# Patient Record
Sex: Male | Born: 1985 | Race: Black or African American | Hispanic: No | Marital: Single | Smoking: Never smoker
Health system: Southern US, Community
[De-identification: ages and names within clinical notes are randomized; demographics above are authoritative.]

---

## 2010-04-05 ENCOUNTER — Emergency Department (HOSPITAL_BASED_OUTPATIENT_CLINIC_OR_DEPARTMENT_OTHER)
Admission: EM | Admit: 2010-04-05 | Discharge: 2010-04-05 | Payer: Self-pay | Source: Home / Self Care | Admitting: Emergency Medicine

## 2010-04-05 LAB — URINALYSIS, ROUTINE W REFLEX MICROSCOPIC
Ketones, ur: NEGATIVE mg/dL
Nitrite: NEGATIVE
Protein, ur: NEGATIVE mg/dL
Urobilinogen, UA: 0.2 mg/dL (ref 0.0–1.0)
pH: 5.5 (ref 5.0–8.0)

## 2010-05-18 ENCOUNTER — Emergency Department (HOSPITAL_BASED_OUTPATIENT_CLINIC_OR_DEPARTMENT_OTHER)
Admission: EM | Admit: 2010-05-18 | Discharge: 2010-05-18 | Disposition: A | Discharge status: Home / Self Care | Payer: Self-pay | Attending: Emergency Medicine | Admitting: Emergency Medicine

## 2010-05-18 DIAGNOSIS — K219 Gastro-esophageal reflux disease without esophagitis: Secondary | ICD-10-CM | POA: Insufficient documentation

## 2010-05-18 DIAGNOSIS — R110 Nausea: Secondary | ICD-10-CM | POA: Insufficient documentation

## 2010-05-18 DIAGNOSIS — K5289 Other specified noninfective gastroenteritis and colitis: Secondary | ICD-10-CM | POA: Insufficient documentation

## 2014-07-12 ENCOUNTER — Emergency Department (HOSPITAL_BASED_OUTPATIENT_CLINIC_OR_DEPARTMENT_OTHER)
Admission: EM | Admit: 2014-07-12 | Discharge: 2014-07-12 | Disposition: A | Discharge status: Home / Self Care | Payer: Medicaid Other | Attending: Emergency Medicine | Admitting: Emergency Medicine

## 2014-07-12 ENCOUNTER — Encounter (HOSPITAL_BASED_OUTPATIENT_CLINIC_OR_DEPARTMENT_OTHER): Payer: Self-pay

## 2014-07-12 DIAGNOSIS — K088 Other specified disorders of teeth and supporting structures: Secondary | ICD-10-CM | POA: Diagnosis not present

## 2014-07-12 DIAGNOSIS — K0889 Other specified disorders of teeth and supporting structures: Secondary | ICD-10-CM

## 2014-07-12 MED ORDER — BUPIVACAINE-EPINEPHRINE (PF) 0.5% -1:200000 IJ SOLN
1.8000 mL | Freq: Once | INTRAMUSCULAR | Status: AC
  Administered 2014-07-12: 1.8 mL
  Filled 2014-07-12: qty 1.8

## 2014-07-12 MED ORDER — PENICILLIN V POTASSIUM 500 MG PO TABS: 500.0000 mg | ORAL_TABLET | Freq: Four times a day (QID) | ORAL | Status: AC

## 2014-07-12 NOTE — ED Provider Notes (Signed)
CSN: [id number]     Arrival date & time 07/12/14  1220 History   First MD Initiated Contact with Patient 07/12/14 1250     Chief Complaint  Patient presents with  . Dental Pain     (Consider location/radiation/quality/duration/timing/severity/associated sxs/prior Treatment) HPI  Nathaniel Allen is a 29 y.o. male presenting with left lower wisdom tooth pain for a while with acute worsening last couple days. Patient reports pain to left jaw with quality and opening his mouth. Patient has been taking NSAIDs with mild improvement but it returns. Cold foods make it better. He has also been taking an antibiotic with the last 2 pills today for possible dental infection. Patient states he is waiting for his Medicare to come through to be able see a dentist. No fevers, chills, facial swelling, tongue swelling, drooling, chest pain, shortness of breath.   History reviewed. No pertinent past medical history. History reviewed. No pertinent past surgical history. No family history on file. History  Substance Use Topics  . Smoking status: Never Smoker   . Smokeless tobacco: Not on file  . Alcohol Use: No    Review of Systems  Constitutional: Negative for fever and chills.  HENT: Positive for dental problem. Negative for drooling.   Respiratory: Negative for shortness of breath.   Cardiovascular: Negative for chest pain.  Gastrointestinal: Negative for nausea and vomiting.      Allergies  Review of patient's allergies indicates no known allergies.  Home Medications   Prior to Admission medications   Medication Sig Start Date End Date Taking? Authorizing Provider  penicillin v potassium (VEETID) 500 MG tablet Take 1 tablet (500 mg total) by mouth 4 (four) times daily. 07/12/14 07/19/14  Oswaldo Conroy, PA-C   BP 135/79 mmHg  Pulse 65  Temp(Src) 99.2 F (37.3 C) (Oral)  Resp 18  Ht  (1.753 m)  Wt 185 lb (83.915 kg)  BMI 27.31 kg/m2  SpO2 99% Physical Exam  Constitutional: He appears  well-developed and well-nourished. No distress.  HENT:  Head: Normocephalic and atraumatic.  Mouth/Throat: Oropharynx is clear and moist. No oropharyngeal exudate.  No trismus or uvula deviation. Easily placed 3 fingers in mouth. No lip, tongue, facial swelling. No swelling under tongue or tenderness. Patient handling secretions. No drooling. Patient with poor dentition. Patient with tenderness to left lower back molar with partial interruption through gum with mild erythema in this gingiva but no drainable abscess.   Eyes: Conjunctivae are normal. Right eye exhibits no discharge. Left eye exhibits no discharge.  Neck: Normal range of motion. Neck supple.  No neck masses or tenderness.  Cardiovascular: Normal rate and regular rhythm.   Pulmonary/Chest: Effort normal and breath sounds normal. No respiratory distress. He has no wheezes.  Abdominal: Soft. He exhibits no distension. There is no tenderness.  Lymphadenopathy:    He has no cervical adenopathy.  Neurological: He is alert. Coordination normal.  Skin: Skin is warm and dry. He is not diaphoretic.  Nursing note and vitals reviewed.   ED Course  Dental Date/Time: 07/12/2014 1:33 PM Performed by: Oswaldo Conroy Authorized by: Oswaldo Conroy Consent: Verbal consent obtained. Risks and benefits: risks, benefits and alternatives were discussed Consent given by: patient Patient understanding: patient states understanding of the procedure being performed Test results: test results available and properly labeled Patient identity confirmed: verbally with patient Time out: Immediately prior to procedure a "time out" was called to verify the correct patient, procedure, equipment, support staff and site/side marked as required. Local  anesthesia used: yes Anesthesia: nerve block Local anesthetic: bupivacaine 0.5% with epinephrine Patient sedated: no Patient tolerance: Patient tolerated the procedure well with no immediate  complications   (including critical care time) Labs Review Labs Reviewed - No data to display  Imaging Review No results found.   EKG Interpretation None      MDM   Final diagnoses:  Pain, dental   Patient with toothache.  No gross abscess.  No history of diabetes. Exam unconcerning for Ludwig's angina or spread of infection.  Will treat with penicillin and NSAIDs.  Pt treated with dental block with improvement of symptoms. Urged patient to follow-up with dentist.  Pt given referral to dentist on call.  Discussed return precautions with patient. Discussed all results and patient verbalizes understanding and agrees with plan.   Oswaldo ConroyVictoria Anayansi Rundquist, PA-C 07/12/14 1434  Blane OharaJoshua Zavitz, MD 07/14/14 [phone]

## 2014-07-12 NOTE — Discharge Instructions (Signed)
Return to the emergency room with worsening of symptoms, new symptoms or with symptoms that are concerning, especially fevers, unable to open mouth fully, facial, tongue swelling, chest pain, shortness of breath. Please take all of your antibiotics until finished!   You may develop abdominal discomfort or diarrhea from the antibiotic.  You may help offset this with probiotics which you can buy or get in yogurt. Do not eat  or take the probiotics until 2 hours after your antibiotic.  Ibuprofen  (2 tablets ) every 5-6 hours for 3-5 days. Call to make appointment with dentist. Number provided above. And or use below information to follow up with dentist.   Emergency Department Resource Guide 1) Find a Doctor and Pay Out of Pocket Although you won't have to find out who is covered by your insurance plan, it is a good idea to ask around and get recommendations. You will then need to call the office and see if the doctor you have chosen will accept you as a new patient and what types of options they offer for patients who are self-pay. Some doctors offer discounts or will set up payment plans for their patients who do not have insurance, but you will need to ask so you aren't surprised when you get to your appointment.  2) Contact Your Local Health Department Not all health departments have doctors that can see patients for sick visits, but many do, so it is worth a call to see if yours does. If you don't know where your local health department is, you can check in your phone book. The CDC also has a tool to help you locate your state's health department, and many state websites also have listings of all of their local health departments.  3) Find a Walk-in Clinic If your illness is not likely to be very severe or complicated, you may want to try a walk in clinic. These are popping up all over the country in pharmacies, drugstores, and shopping centers. They're usually staffed by nurse  practitioners or physician assistants that have been trained to treat common illnesses and complaints. They're usually fairly quick and inexpensive. However, if you have serious medical issues or chronic medical problems, these are probably not your best option.  No Primary Care Doctor: - Call Health Connect at  [phone] - they can help you locate a primary care doctor that  accepts your insurance, provides certain services, etc. - Physician Referral Service- [phone]  Chronic Pain Problems: Organization         Address  Phone   Notes  [facility]  [phone] Patients need to be referred by their primary care doctor.   Medication Assistance: Organization         Address  Phone   Notes  [facility] Medication [facility] [address] [place] [place], [place] [address] [phone] --Must be a resident of [facility] -- Must have NO insurance coverage whatsoever (no Medicaid/ Medicare, etc.) -- The pt. MUST have a primary care doctor that directs their care regularly and follows them in the community   MedAssist  [phone]   [employer]  [phone]    Agencies that provide inexpensive medical care: Organization         Address  Phone   Notes  [facility]  [phone]   [facility]    [phone]   [facility] [address]  [place], [place] [address] [phone]   [facility] [address], [place] [phone]) [phone]   Planned Parenthood    [phone]) [phone]   [facility]    [phone](336) [phone]   [facility]  [address], [place] Phone:  [phone](336) [phone], Fax:  [phone]) [phone] Hours of Operation:  9 am - 6 pm, M-F.  Also accepts Medicaid/Medicare and self-pay.  [facility]  [address], [place] Phone: [phone]) [phone], Fax: [phone]) [phone]. Hours of Operation:  8:30 am -  5:30 pm, M-F.  Also accepts Medicaid and self-pay.  [facility] [place] [address], [place] Phone: [phone](336) [phone]   [facility] [address], [place], [place] [phone], Ext. [phone] Mondays & Thursdays: 7-9 AM.  First 15 patients are seen on a first come, first serve basis.    Medicaid-accepting [facility] Providers:  Organization         Address  Phone   Notes  [facility] [address], [place] [phone]) [phone] Also accepts self-pay patients.  [facility] [address], [place]  [phone](336) [phone]   [facility] [address], [place] [phone](336) [phone]   [facility] [address] [place] [address], [place] [phone]) [phone]   Renaye RakersVeita Bland [address], [place]   [phone]) [phone] Only accepts [place] Access [place]icaid patients after they have their name applied to their card.   Self-Pay (no insurance) in [facility]:  Organization         Address  Phone   Notes  Sickle Cell Patients, [facility] [address], [place] [phone]) [phone]   [facility] [address], [place] [phone]) [phone]   [facility]  [address], [place] [phone](336) [phone]   [facility]/Dr. Osei-Bonsu  [address] [place] [address], [place] or [phone], [place] [phone]) [phone] Phone number for both [place] and [place] locations is the same.  [facility] [address], [place] [phone]) [phone]   [facility]  [address] [place] [address], [place] or [address] [phone]) [phone]   [facility] [address], [place] [phone](336) [phone], phone; [phone](336) [phone], fax Sees patients 1st and 3rd Saturday of every month.  Must not qualify for public or private insurance (i.e. Medicaid, Medicare, [place] Health Choice,  Veterans' Benefits)  Household income should be no more than 200% of the poverty level The clinic cannot treat you if you are pregnant or think you are pregnant  Sexually transmitted diseases are not treated at the clinic.    Dental Care: Organization         Address  Phone  Notes  [facility] Department of [facility] [address], [place] [phone]) [phone] Accepts children up to age 31 who are enrolled in [place]icaid or [place] Health Choice; pregnant women with a Medicaid card; and children who have applied for Medicaid or [place] Health Choice, but were declined, whose parents can pay a reduced fee at time of service.  [facility] of [facility] [place]  [address], [place] [phone]) [phone] Accepts children up to age 31 who are enrolled in [place]icaid or [place] Health Choice;  pregnant women with a Medicaid card; and children who have applied for Medicaid or [place] Health Choice, but were declined, whose parents can pay a reduced fee at time of service.  [facility]  [address], [place] [phone]) [phone] Patients are seen by appointment only. Walk-ins are not accepted. [facility] will see patients 31 years of age and older. Monday - Tuesday (8am-5pm) Most Wednesdays (8:30-5pm) $30 per visit, cash only  [facility]  [address], [facility] [place] [phone] Patients are seen by appointment only. Walk-ins are not accepted. [facility] will see patients 31 years of age and older. One Wednesday Evening (Monthly: Volunteer Based).  $30 per visit, cash only  [facility]  [phone] for adults; Children under age 494, call Graduate Pediatric Dentistry at [phone]) [phone]. Children aged 254-14, please call [phone] to request a pediatric application.  Dental services are provided in all areas of dental care including fillings, crowns and bridges, complete and  partial dentures, implants, gum treatment, root canals, and extractions. Preventive care is also provided. Treatment is provided to both adults and children. Patients are selected via a lottery and there is often a waiting list.   [facility] [address], [place]  [phone]) [phone] [email or web address]   [facility] [address], [place], [place] [phone], Ext. [phone] Second and Fourth Thursday of each month, opens at 6:30 AM; Clinic ends at 9 AM.  Patients are seen on a first-come first-served basis, and a limited number are seen during each clinic.   [facility]  [address], [place], [place] [phone](336) [phone]   Eligibility Requirements You must have lived in [place], [place], or [place] counties for at least the last three months.   You cannot be eligible for state or federal sponsored National Cityhealthcare insurance, including CIGNAVeterans Administration, [place]icaid, or [employer] EntertainmentMedicare.   You generally cannot be eligible for healthcare insurance through your employer.    How to apply: Eligibility screenings are held every Tuesday and Wednesday afternoon from 1:00 pm until 4:00 pm. You do not need an appointment for the interview!  [facility] [address], [place], [place] [phone]   [facility]  [phone]   [facility]  [phone]   [facility]  [phone]    Behavioral Health Resources in the Community: Intensive Outpatient Programs Organization         Address  Phone  Notes  [facility] [address], [place], [place] [phone]   [facility] [address], [place], [place] [phone]   [facility] [address], [place], [place]  [phone]   [facility] [address],  [place], [place] [phone] or [phone]   Substance Abuse Resources Organization          Address  Phone  Notes  [facility]  [phone]   [facility]  [phone]   [facility]  [phone]   [facility]  [phone]   Residential & Outpatient Substance Abuse Program  [phone]   Psychological Services Organization         Address  Phone  Notes  [facility]  [phone]- [phone]   [facility]  [phone]- [phone]   [facility] [address], [place] [phone] or [phone]    Mobile  Crisis Teams Organization         Address  Phone  Notes  [facility], Mobile Crisis Care Unit  [phone]   [facility]  [address] [place], [place] [phone]   [facility] De[facility]sch [address] [place] [phone]    Self-Help/Support Groups Organization         Address  Phone             Notes  Mental Health Assoc. of [place] - variety of support groups  336- [phone] Call for more information  Narcotics Anonymous (NA), [facility]aring [facility] [address], [place]  2 meetings at this location   Statisticianesidential Treatment Programs Organization         Address  Phone  Notes  [facility] [address],    [place]  [phone]   [facility]  [address], [place] [phone], [place], [place] [phone]   [facility] [address], [place] [phone] Admissions: 8am-3pm M-F  [facility] [address]    [place], [place] [phone]   [facility] [address], [place], [place] [phone]   [facility] [address]  [place], [place] [phone]   [facility] - Intensive Outpatient [address], [place], [place] [phone]   [facility] ([facility] [address]  [place], [place] [phone] or [phone]   [facility] ([facility]) [address] [place], [place]  [phone] Accepts Medicaid  [facility] [address]  [place] [phone] Substance Abuse/Addiction Treatment   [facility] Behavioral Health Resources Organization         Address  Phone  Notes  [facility]  [phone]   Angie FavaJulie Brannon, PhD [address], [address] [place], [place]   [phone](336) [phone] or [phone]   [facility]   [address] [place], [place] [phone]) [phone]   [facility] [address], [place], [place] [phone](336) [phone] Insurance/Medicaid/sponsorship through [facility]  [facility] Families [address]                                    [place], [place] [phone](336) [phone] Therapy/tele-psych/case  [facility] [address].   [place], [place] [phone](336) [phone]    Dr. Lolly MustacheArfeen  [phone](336) [phone]   [facility]  [employer] [facility] 1) [address], [place] 2) [address], [place] 3)  [address], [place] [phone]) [phone](336) [phone]  [phone](336) [phone]   [facility] Child Abuse Hotline [phone](336) [phone] or [phone](336) [phone] (After Hours)

## 2014-07-12 NOTE — ED Notes (Addendum)
Left lower wisdom tooth pain "for a while"-pain to left jaw with difficulty opening mouth

## 2014-07-12 NOTE — ED Notes (Signed)
PA at bedside.

## 2014-10-23 ENCOUNTER — Emergency Department (HOSPITAL_BASED_OUTPATIENT_CLINIC_OR_DEPARTMENT_OTHER)
Admission: EM | Admit: 2014-10-23 | Discharge: 2014-10-23 | Disposition: A | Discharge status: Home / Self Care | Payer: Medicaid Other | Attending: Physician Assistant | Admitting: Physician Assistant

## 2014-10-23 ENCOUNTER — Encounter (HOSPITAL_BASED_OUTPATIENT_CLINIC_OR_DEPARTMENT_OTHER): Payer: Self-pay | Admitting: *Deleted

## 2014-10-23 DIAGNOSIS — G5621 Lesion of ulnar nerve, right upper limb: Secondary | ICD-10-CM | POA: Diagnosis not present

## 2014-10-23 DIAGNOSIS — R202 Paresthesia of skin: Secondary | ICD-10-CM | POA: Insufficient documentation

## 2014-10-23 DIAGNOSIS — M25562 Pain in left knee: Secondary | ICD-10-CM | POA: Diagnosis not present

## 2014-10-23 DIAGNOSIS — R200 Anesthesia of skin: Secondary | ICD-10-CM | POA: Diagnosis present

## 2014-10-23 DIAGNOSIS — M549 Dorsalgia, unspecified: Secondary | ICD-10-CM | POA: Insufficient documentation

## 2014-10-23 NOTE — ED Notes (Signed)
Sling applied to rt arm per EDP orders, pt teaching done on re: appropriate sling application

## 2014-10-23 NOTE — Discharge Instructions (Signed)
Peripheral Neuropathy °Peripheral neuropathy is a type of nerve damage. It affects nerves that carry signals between the spinal cord and other parts of the body. These are called peripheral nerves. With peripheral neuropathy, one nerve or a group of nerves may be damaged.  °CAUSES  °Many things can damage peripheral nerves. For some people with peripheral neuropathy, the cause is unknown. Some causes include: °· Diabetes. This is the most common cause of peripheral neuropathy. °· Injury to a nerve. °· Pressure or stress on a nerve that lasts a long time. °· Too little vitamin B. Alcoholism can lead to this. °· Infections. °· Autoimmune diseases, such as multiple sclerosis and systemic lupus erythematosus. °· Inherited nerve diseases. °· Some medicines, such as cancer drugs. °· Toxic substances, such as lead and mercury. °· Too little blood flowing to the legs. °· Kidney disease. °· Thyroid disease. °SIGNS AND SYMPTOMS  °Different people have different symptoms. The symptoms you have will depend on which of your nerves is damaged.  Common symptoms include: °· Loss of feeling (numbness) in the feet and hands. °· Tingling in the feet and hands. °· Pain that burns. °· Very sensitive skin. °· Weakness. °· Not being able to move a part of the body (paralysis). °· Muscle twitching. °· Clumsiness or poor coordination. °· Loss of balance. °· Not being able to control your bladder. °· Feeling dizzy. °· Sexual problems. °DIAGNOSIS  °Peripheral neuropathy is a symptom, not a disease. Finding the cause of peripheral neuropathy can be hard. To figure that out, your health care provider will take a medical history and do a physical exam. A neurological exam will also be done. This involves checking things affected by your brain, spinal cord, and nerves (nervous system). For example, your health care provider will check your reflexes, how you move, and what you can feel.  °Other types of tests may also be ordered, such as: °· Blood  tests. °· A test of the fluid in your spinal cord. °· Imaging tests, such as CT scans or an MRI. °· Electromyography (EMG). This test checks the nerves that control muscles. °· Nerve conduction velocity tests. These tests check how fast messages pass through your nerves. °· Nerve biopsy. A small piece of nerve is removed. It is then checked under a microscope. °TREATMENT  °· Medicine is often used to treat peripheral neuropathy. Medicines may include: °¨ Pain-relieving medicines. Prescription or over-the-counter medicine may be suggested. °¨ Antiseizure medicine. This may be used for pain. °¨ Antidepressants. These also may help ease pain from neuropathy. °¨ Lidocaine. This is a numbing medicine. You might wear a patch or be given a shot. °¨ Mexiletine. This medicine is typically used to help control irregular heart rhythms. °· Surgery. Surgery may be needed to relieve pressure on a nerve or to destroy a nerve that is causing pain. °· Physical therapy to help movement. °· Assistive devices to help movement. °HOME CARE INSTRUCTIONS  °· Only take over-the-counter or prescription medicines as directed by your health care provider. Follow the instructions carefully for any given medicines. Do not take any other medicines without first getting approval from your health care provider. °· If you have diabetes, work closely with your health care provider to keep your blood sugar under control. °· If you have numbness in your feet: °¨ Check every day for signs of injury or infection. Watch for redness, warmth, and swelling. °¨ Wear padded socks and comfortable shoes. These help protect your feet. °· Do not do   things that put pressure on your damaged nerve.  Do not smoke. Smoking keeps blood from getting to damaged nerves.  Avoid or limit alcohol. Too much alcohol can cause a lack of B vitamins. These vitamins are needed for healthy nerves.  Develop a good support system. Coping with peripheral neuropathy can be  stressful. Talk to a mental health specialist or join a support group if you are struggling.  Follow up with your health care provider as directed. SEEK MEDICAL CARE IF:   You have new signs or symptoms of peripheral neuropathy.  You are struggling emotionally from dealing with peripheral neuropathy.  You have a fever. SEEK IMMEDIATE MEDICAL CARE IF:   You have an injury or infection that is not healing.  You feel very dizzy or begin vomiting.  You have chest pain.  You have trouble breathing. Document Released: 02/16/2002 Document Revised: 11/08/2010 Document Reviewed: 11/03/2012 [facility] Patient Information 2015 [place], [place]. This information is not intended to replace advice given to you by your health care provider. Make sure you discuss any questions you have with your health care provider.  Electromyography (EMG) Test This is a test in which very small electrodes are placed into your muscle tissue. It looks at the electrical impulses of your muscle tissue. This test is used to determine whether or not there are involuntary or spontaneous muscle movements. Involuntary or spontaneous means muscle movements that happen by themselves. This may indicate injury or disease of the nerves which supply that muscle. PREPARATION FOR TEST No preparation or fasting is necessary. Some stimulants such as caffeine and tobacco may need to be avoided for 2-3 hours before test or as instructed by your caregiver. NORMAL FINDINGS No evidence of neuromuscular abnormalities. Ranges for normal findings may vary among different laboratories and hospitals. You should always check with your doctor after having lab work or other tests done to discuss the meaning of your test results and whether your values are considered within normal limits. MEANING OF TEST  Your caregiver will go over the test results with you and discuss the importance and meaning of your results, as well as treatment options and the  need for additional tests if necessary. OBTAINING THE TEST RESULTS It is your responsibility to obtain your test results. Ask the lab or department performing the test when and how you will get your results. Document Released: 06/29/2004 Document Revised: 05/21/2011 Document Reviewed: 02/06/2008 [facility] Patient Information 2015 [place], [place]. This information is not intended to replace advice given to you by your health care provider. Make sure you discuss any questions you have with your health care provider.  Emergency Department Resource Guide 1) Find a Doctor and Pay Out of Pocket Although you won't have to find out who is covered by your insurance plan, it is a good idea to ask around and get recommendations. You will then need to call the office and see if the doctor you have chosen will accept you as a new patient and what types of options they offer for patients who are self-pay. Some doctors offer discounts or will set up payment plans for their patients who do not have insurance, but you will need to ask so you aren't surprised when you get to your appointment.  2) Contact Your Local Health Department Not all health departments have doctors that can see patients for sick visits, but many do, so it is worth a call to see if yours does. If you don't know where your local health  department is, you can check in your phone book. The CDC also has a tool to help you locate your state's health department, and many state websites also have listings of all of their local health departments.  3) Find a Walk-in Clinic If your illness is not likely to be very severe or complicated, you may want to try a walk in clinic. These are popping up all over the country in pharmacies, drugstores, and shopping centers. They're usually staffed by nurse practitioners or physician assistants that have been trained to treat common illnesses and complaints. They're usually fairly quick and inexpensive. However, if you  have serious medical issues or chronic medical problems, these are probably not your best option.  No Primary Care Doctor: - Call Health Connect at  [phone] - they can help you locate a primary care doctor that  accepts your insurance, provides certain services, etc. - Physician Referral Service- [phone]  Chronic Pain Problems: Organization         Address  Phone   Notes  [facility]  [phone] Patients need to be referred by their primary care doctor.   Medication Assistance: Organization         Address  Phone   Notes  [facility] [address] [place] [place], [place] [address] [phone] --Must be a resident of [facility] -- Must have NO insurance coverage whatsoever (no Medicaid/ Medicare, etc.) -- The pt. MUST have a primary care doctor that directs their care regularly and follows them in the community   [facility]ist  [phone]   [employer]  [phone]    Agencies that provide inexpensive medical care: Organization         Address  Phone   Notes  [facility]  [phone]   [facility]    [phone]   [facility] [address] [place], [place] [address] [phone]   [facility] [address], [place] [phone]   Planned Parenthood    [phone]   [facility]    [phone]   Community Health and [facility]  [address], [place] Phone:  [phone], Fax:  [phone] Hours of Operation:  9 am - 6 pm, M-F.  Also accepts Medicaid/Medicare and self-pay.  [facility]  [address], [place] Phone: [phone], Fax: [phone]. Hours of Operation:  8:30 am - 5:30 pm, M-F.  Also accepts Medicaid and self-pay.  [facility] [place] [address], [place] Phone: [phone]   [facility] [address]  Natasha Bence [place], [place] [phone], Ext. [phone] Mondays & Thursdays: 7-9 AM.  First 15 patients are seen on a first come, first serve basis.    Medicaid-accepting [facility] Providers:  Organization         Address  Phone   Notes  [facility] [address], [place] [phone] Also accepts self-pay patients.  [facility] [address] Laurell Josephs [place], [place]  [phone]   [facility] [address], [place] [phone]   [facility] [address] [place] [phone]   Renaye Rakers [address], [place]   [phone] Only accepts [place] Access [place] patients after they have  their name applied to their card.   Self-Pay (no insurance) in [facility]:  Organization         Address  Phone   Notes  Sickle Cell Patients, [facility] [address] [place], [place] [phone]   [facility] [address] [place], [place] [phone]   [facility] [place]  [address],  [phone]   [facility]/Dr. Osei-Bonsu  [address], [place] or [address], [place] [phone] Phone number for both [place] and [place] locations is the same.  [facility] [address] [place] [phone]   [facility] [address], [place] or [address] [place] [address] [phone]   [facility] [address], [place] [phone], phone; [phone], fax Sees patients 1st and 3rd Saturday of every month.  Must not qualify for public or private insurance (i.e. Medicaid, Medicare, [place] Health Choice, Veterans' Benefits)  Household income should be no more than 200% of the poverty level The clinic cannot treat you if you are pregnant or think you are pregnant   Sexually transmitted diseases are not treated at the clinic.    Dental Care: Organization         Address  Phone  Notes  [facility] Department of [facility] [address] [place], [place] [phone] Accepts children up to age 65 who are enrolled in [place] or [place] Health Choice; pregnant women with a Medicaid card; and children who have applied for Medicaid or [place] Health Choice, but were declined, whose parents can pay a reduced fee at time of service.  [facility] of [facility]  [address], [place] [phone] Accepts children up to age 74 who are enrolled in [place] or [place] Health Choice; pregnant women with a Medicaid card; and children who have applied for Medicaid or [place] Health Choice, but were declined, whose parents can pay a reduced fee at time of service.  [facility]  [address] [place], [place] [phone] Patients are seen by appointment only. Walk-ins are not accepted. [facility] will see patients 12 years of age and older. Monday - Tuesday (8am-5pm) Most Wednesdays (8:30-5pm) $30 per visit, cash only  [facility] Adult Dental Access [facility]  [address], [facility] [phone] Patients are seen by appointment only. Walk-ins are not accepted. [facility] will see patients 74 years of age and older. One Wednesday Evening (Monthly: Volunteer Based).  $30 per visit, cash only  [employer]  [phone] for adults; Children under age 23, call Graduate Pediatric Dentistry at [phone]. Children aged 26-14, please call [phone] to request a pediatric application.  Dental services are provided in all areas of dental care including fillings, crowns and bridges, complete and partial dentures, implants, gum treatment, root canals, and extractions. Preventive care is also provided. Treatment is provided to both adults and children. Patients  are selected via a lottery and there is often a waiting list.   [facility] [address] [place]  [phone] [email or web address]   [facility] [address] [place], [place] [phone], Ext. [phone] Second and Fourth Thursday of each month, opens at 6:30 AM; Clinic ends at 9 AM.  Patients are seen on  a [employer]	[place] basis, and a limited number are seen during each clinic.   [facility]  [address] Ether Griffins [place], [place] [phone]   Eligibility Requirements You must have lived in [place], [place], or [place] counties for at least the last three months.   You cannot be eligible for state or federal sponsored [place], including [employer], [place], or [employer].   You generally cannot be eligible for healthcare insurance through your employer.    How to apply: Eligibility screenings are held every Tuesday and Wednesday afternoon from 1:00 pm until 4:00 pm. You do not need an appointment for the interview!  [facility] [address], [place], [place] [phone]   [facility] Health Department  [phone]   [facility] Health Department  [phone]   [facility] Health Department  [phone]    Behavioral Health Resources in the Community: Intensive Outpatient Programs Organization         Address  Phone  Notes  [facility] Services [address] [place], [place] [phone]   [facility] [address], [place], [place] [phone]   [facility] [address] [place], [place]  [phone]   [facility] [address],  [place], [place] [phone] or [phone]   Substance Abuse Resources Organization         Address  Phone  Notes  [facility]  [phone]   [facility]  [phone]   [facility] [place]  [phone]   Floydene Flock  [phone]    Residential & Outpatient Substance Abuse Program  [phone]   Psychological Services Organization         Address  Phone  Notes  [facility] Behavioral Health  [phone]   [facility]  [phone]   [facility] [address], [place] [phone] or [phone]    Mobile Crisis Teams Organization         Address  Phone  Notes  [facility], Mobile Crisis Care Unit  [phone]   [facility]  [address] [place], [place] [phone]   Doristine Locks [address] [place] [phone]    Self-Help/Support Groups Organization         Address  Phone             Notes  [facility] [place] - variety of support groups  [phone]- [phone] Call for more information  Narcotics Anonymous (NA), [facility] [address], [employer] [place]  2 meetings at this location   Statistician         Address  Phone  Notes  [facility] [address],    [place]  [phone]   [facility]  [address] [place] [address], [place], [place] [phone]   [facility] [address] [place], [place] [phone] Admissions: 8am-3pm M-F  [facility] [address],    [place], [place] [phone]   [facility] [address] Starling Manns [place], [place] [phone]   [facility] [address]  [place], [place] [phone]   [facility] - Intensive Outpatient [address] Laurell Josephs [address], [place], [place] [phone]   [facility] ([facility] [address] [place]  [place], [place] [phone] or [phone]   [facility] ([facility]) [address], [place], [place] [phone]  Accepts Medicaid  [facility] [place] [address],  [place] [phone] Substance Abuse/Addiction Treatment   [facility] Organization         Address  Phone  Notes  [facility]  [phone]   Angie Fava, PhD [address] Ervin Knack [place], [place]   [phone] or [phone]   [facility] Behavioral   [address] [place], [place] [phone]   [facility] [address], [place], [place] [phone] Insurance/Medicaid/sponsorship through [facility] and Families [address]                                    [place], [place] [phone] Therapy/tele-psych/case  [facility] [address], [place] [phone]    Dr. Lolly Mustache  [phone]   [facility] [place]  [employer] [facility] Dept. 1) [address] [place] 2) [address], [place] 3)  [address], [place] [phone]  [phone]   [facility] Child Abuse Hotline [phone] or [phone] (After Hours)

## 2014-10-23 NOTE — ED Notes (Signed)
Pt moving mattresses, states had pain occur suddenly in back at rt scapula area, then felt numbness to rt arm, hand, rt 5th digit

## 2014-10-23 NOTE — ED Provider Notes (Signed)
CSN: [id number]     Arrival date & time 10/23/14  1021 History   First MD Initiated Contact with Patient 10/23/14 1044     Chief Complaint  Patient presents with  . Back Pain     (Consider location/radiation/quality/duration/timing/severity/associated sxs/prior Treatment) HPI  Patient presenting with right ulnar neuropathy 3 days. Patient states it happened when he was lifting a mattress a couple days ago. The neuropathy is from his elbow down. in the ulnar distribution. He reports some numbness and tingling. No weakness. Patient had in the past. It is made better when held it 90. Patient also 1 advice on his left knee. He had some pain in his left knee. Negative x-ray recently. He is concerned about a ligamentous injury from an injury when he was 11.   History reviewed. No pertinent past medical history. History reviewed. No pertinent past surgical history. No family history on file. Social History  Substance Use Topics  . Smoking status: Never Smoker   . Smokeless tobacco: None  . Alcohol Use: No    Review of Systems  Constitutional: Negative for fever and activity change.  HENT: Negative for drooling and hearing loss.   Eyes: Negative for discharge and redness.  Respiratory: Negative for cough.   Cardiovascular: Negative for chest pain.  Gastrointestinal: Negative for abdominal pain.  Allergic/Immunologic: Negative for immunocompromised state.  Psychiatric/Behavioral: Negative for behavioral problems.  All other systems reviewed and are negative.     Allergies  Review of patient's allergies indicates no known allergies.  Home Medications   Prior to Admission medications   Not on File   BP 138/71 mmHg  Pulse 69  Temp(Src) 98.3 F (36.8 C) (Oral)  Resp 20  Ht 5\' 9"  (1.753 m)  Wt 188 lb (85.276 kg)  BMI 27.75 kg/m2  SpO2 98% Physical Exam  Constitutional: He is oriented to person, place, and time. He appears well-nourished.  HENT:  Head: Normocephalic.   Mouth/Throat: Oropharynx is clear and moist.  Eyes: Conjunctivae are normal.  Neck: No tracheal deviation present.  Pulmonary/Chest: Effort normal. No stridor.  Musculoskeletal: Normal range of motion. He exhibits no edema.  Left knee with mild tenderness over the MCL.  Tapping ulnar nerve re-creates symptoms of numbness and tingling in his right pinky and fourth finger. Normal strength.  Neurological: He is oriented to person, place, and time. No cranial nerve deficit.  Skin: Skin is warm and dry. No rash noted. He is not diaphoretic.  Psychiatric: He has a normal mood and affect. His behavior is normal.  Nursing note and vitals reviewed.   ED Course  Procedures (including critical care time) Labs Review Labs Reviewed - No data to display  Imaging Review No results found. I, Courteney L MacKuen, personally reviewed and evaluated these images and lab results as part of my medical decision-making.   EKG Interpretation None      MDM   Final diagnoses:  None    Patient is a 29 year old male presenting with right ulnar neuropathy and left knee pain. I think both of these would best be followed by primary care provider. Patient may require follow-up with either neurology for EMG studies of the right ulnar nerve or orthopedics for further follow-up for right ulnar neuropathy.  He will also need follow-up for orthopedics for his left knee for MRI for further study of ligamentous injury.  We can give him a name for orthopedics and recommend that he follows up and establishes care with a primary care provider.  Courteney Randall An, MD 10/23/14 1127

## 2014-11-03 ENCOUNTER — Ambulatory Visit: Payer: Medicaid Other | Admitting: Family Medicine

## 2020-11-20 ENCOUNTER — Emergency Department (HOSPITAL_BASED_OUTPATIENT_CLINIC_OR_DEPARTMENT_OTHER)
Admission: EM | Admit: 2020-11-20 | Discharge: 2020-11-20 | Disposition: A | Discharge status: Home / Self Care | Payer: Medicaid Other | Attending: Emergency Medicine | Admitting: Emergency Medicine

## 2020-11-20 ENCOUNTER — Other Ambulatory Visit: Payer: Self-pay

## 2020-11-20 ENCOUNTER — Encounter (HOSPITAL_BASED_OUTPATIENT_CLINIC_OR_DEPARTMENT_OTHER): Payer: Self-pay

## 2020-11-20 DIAGNOSIS — H9193 Unspecified hearing loss, bilateral: Secondary | ICD-10-CM | POA: Diagnosis present

## 2020-11-20 DIAGNOSIS — H6123 Impacted cerumen, bilateral: Secondary | ICD-10-CM | POA: Diagnosis not present

## 2020-11-20 NOTE — ED Triage Notes (Signed)
Pt reports trouble hearing out of both ears and thinks he has an ear infection or wax build up. He denies pain, just states trouble hearing "like something is clouding my eardrum."

## 2020-11-20 NOTE — Discharge Instructions (Addendum)
You had earwax buildup in both ears.  Use a washcloth or rag to clean out the ears.  You should not put anything in your ear that is smaller than your fingertip.

## 2020-11-20 NOTE — ED Notes (Signed)
Dr. Horton at bedside. 

## 2020-11-20 NOTE — ED Provider Notes (Signed)
[facility] EMERGENCY DEPARTMENT Provider Note   CSN: [id number] Arrival date & time: 11/20/20  0404     History Chief Complaint  Patient presents with   Otalgia    Nathaniel Allen is a 35 y.o. male.  HPI     This is a 34 year old male who presents with diminished hearing in both ears.  Patient reports that he woke up yesterday morning with fullness and decreased hearing in the right ear.  He states that the ear felt full and uncomfortable but no significant pain.  Rates his discomfort at 1 out of 10.  He woke up this morning with the left ear feeling full and with decreased hearing.  He believes he may have earwax in both ears.  No recent fevers or upper respiratory symptoms.  Patient reports he usually uses a towel to try to clean out his ears.  History reviewed. No pertinent past medical history.  There are no problems to display for this patient.   History reviewed. No pertinent surgical history.     No family history on file.  Social History   Tobacco Use   Smoking status: Never  Substance Use Topics   Alcohol use: No   Drug use: No    Home Medications Prior to Admission medications   Not on File    Allergies    Patient has no known allergies.  Review of Systems   Review of Systems  Constitutional:  Negative for fever.  HENT:  Positive for hearing loss. Negative for ear discharge and ear pain.   All other systems reviewed and are negative.  Physical Exam Updated Vital Signs BP 138/82 (BP Location: Right Arm)   Pulse 60   Temp 98.1 F (36.7 C) (Oral)   Resp 16   Ht 1.778 m (5\' 10" )   Wt 80.3 kg   SpO2 100%   BMI 25.40 kg/m   Physical Exam Vitals and nursing note reviewed.  Constitutional:      Appearance: He is well-developed. He is not ill-appearing.  HENT:     Head: Normocephalic and atraumatic.     Ears:     Comments: Bilateral cerumen impaction After cleaning, TMs clear, light reflex intact, no erythema    Mouth/Throat:      Mouth: Mucous membranes are moist.  Eyes:     Pupils: Pupils are equal, round, and reactive to light.  Cardiovascular:     Rate and Rhythm: Normal rate and regular rhythm.  Pulmonary:     Effort: Pulmonary effort is normal. No respiratory distress.  Abdominal:     Palpations: Abdomen is soft.  Musculoskeletal:     Cervical back: Neck supple.  Lymphadenopathy:     Cervical: No cervical adenopathy.  Skin:    General: Skin is warm and dry.  Neurological:     Mental Status: He is alert and oriented to person, place, and time.  Psychiatric:        Mood and Affect: Mood normal.    ED Results / Procedures / Treatments   Labs (all labs ordered are listed, but only abnormal results are displayed) Labs Reviewed - No data to display  EKG None  Radiology No results found.  Procedures Procedures   Medications Ordered in ED Medications - No data to display  ED Course  I have reviewed the triage vital signs and the nursing notes.  Pertinent labs & imaging results that were available during my care of the patient were reviewed by me and considered  in my medical decision making (see chart for details).    MDM Rules/Calculators/A&P                           Patient presents with diminished hearing and fullness in the bilateral ears.  Nontoxic vital signs are reassuring.  Physical exam with bilateral cerumen impaction.  Nursing irrigated bilateral ears with significant removal of earwax.  On repeat exam, TMs are intact and no evidence of ear infection.  After history, exam, and medical workup I feel the patient has been appropriately medically screened and is safe for discharge home. Pertinent diagnoses were discussed with the patient. Patient was given return precautions.  Final Clinical Impression(s) / ED Diagnoses Final diagnoses:  Bilateral impacted cerumen    Rx / DC Orders ED Discharge Orders     None        Nathaniel Allen, Mayer Masker, MD 11/20/20 [phone]

## 2021-07-13 ENCOUNTER — Encounter (HOSPITAL_BASED_OUTPATIENT_CLINIC_OR_DEPARTMENT_OTHER): Payer: Self-pay

## 2021-07-13 ENCOUNTER — Other Ambulatory Visit: Payer: Self-pay

## 2021-07-13 ENCOUNTER — Emergency Department (HOSPITAL_BASED_OUTPATIENT_CLINIC_OR_DEPARTMENT_OTHER)
Admission: EM | Admit: 2021-07-13 | Discharge: 2021-07-13 | Disposition: A | Discharge status: Home / Self Care | Payer: No Typology Code available for payment source | Attending: Emergency Medicine | Admitting: Emergency Medicine

## 2021-07-13 ENCOUNTER — Emergency Department (HOSPITAL_BASED_OUTPATIENT_CLINIC_OR_DEPARTMENT_OTHER): Payer: No Typology Code available for payment source

## 2021-07-13 DIAGNOSIS — M79601 Pain in right arm: Secondary | ICD-10-CM | POA: Insufficient documentation

## 2021-07-13 DIAGNOSIS — M545 Low back pain, unspecified: Secondary | ICD-10-CM | POA: Diagnosis not present

## 2021-07-13 MED ORDER — NAPROXEN 500 MG PO TABS: 500.0000 mg | ORAL_TABLET | Freq: Two times a day (BID) | ORAL | 0 refills | Status: AC

## 2021-07-13 MED ORDER — METHOCARBAMOL 500 MG PO TABS: 500.0000 mg | ORAL_TABLET | Freq: Two times a day (BID) | ORAL | 0 refills | Status: AC
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
## 2021-07-13 NOTE — ED Notes (Signed)
Patient ambulatory to Xray.

## 2022-07-25 ENCOUNTER — Other Ambulatory Visit: Payer: Self-pay

## 2022-07-25 ENCOUNTER — Other Ambulatory Visit (HOSPITAL_BASED_OUTPATIENT_CLINIC_OR_DEPARTMENT_OTHER): Payer: Self-pay

## 2022-07-25 ENCOUNTER — Encounter (HOSPITAL_BASED_OUTPATIENT_CLINIC_OR_DEPARTMENT_OTHER): Payer: Self-pay

## 2022-07-25 ENCOUNTER — Emergency Department (HOSPITAL_BASED_OUTPATIENT_CLINIC_OR_DEPARTMENT_OTHER)
Admission: EM | Admit: 2022-07-25 | Discharge: 2022-07-25 | Disposition: A | Discharge status: Home / Self Care | Payer: Medicaid Other | Attending: Emergency Medicine | Admitting: Emergency Medicine

## 2022-07-25 DIAGNOSIS — H9202 Otalgia, left ear: Secondary | ICD-10-CM | POA: Diagnosis present

## 2022-07-25 DIAGNOSIS — H7292 Unspecified perforation of tympanic membrane, left ear: Secondary | ICD-10-CM

## 2022-07-25 DIAGNOSIS — H6122 Impacted cerumen, left ear: Secondary | ICD-10-CM

## 2022-07-25 MED ORDER — AMOXICILLIN-POT CLAVULANATE 875-125 MG PO TABS
1.0000 | ORAL_TABLET | Freq: Two times a day (BID) | ORAL | 0 refills | Status: DC
  Filled 2022-07-25: qty 14, 7d supply, fill #0

## 2022-07-25 NOTE — Discharge Instructions (Addendum)
Please make an appointment with your primary care provider or the one I have attached for you regarding recent symptoms and ER visit.  Please read the attachment I have attached your in terms of ear irrigation.  Your left eardrum also had a perforation and I have also attached an ENT specialist for you to follow-up with as well regarding your perforated eardrum.  Please keep your ear dry and out of water in terms of swimming/shower, avoid loud music, avoid sticking objects in your ear or trying to rinse out your ear.  If you begin to have swelling in the area, drainage, worsening ear pain not controlled Tylenol or ibuprofen, fevers or other worsening symptoms please return to ER.

## 2022-07-25 NOTE — ED Notes (Addendum)
Bilateral ear wash complete, light brown fluid with dark brown solid material return

## 2022-07-25 NOTE — ED Triage Notes (Signed)
Pt reports clogged ear for the three days. Pt states he has a history of clogged ear wax and was seen a year ago for the same issue. States he has used peroxide and ear drops prior to coming to ED.

## 2022-07-25 NOTE — ED Provider Notes (Addendum)
[place] EMERGENCY DEPARTMENT AT [facility] Provider Note   CSN: [id number] Arrival date & time: 07/25/22  1111     History  Chief Complaint  Patient presents with   Otalgia    Nathaniel Allen is a 37 y.o. male history of cerumen impaction presented after having cerumen buildup in his left ear for the past 2 days.  Patient states he has decreased hearing out of his ear and this feels like a cerumen impaction.  Patient has tried to use peroxide and the shower handle to rinse out his ears to no success.  Patient states he just wants to have his ears cleaned out and be discharged.  Patient denied fevers, ear pain, mastoid pain or swelling, dysphagia, sinus pain, vision changes, headache, neck pain/vomiting, chest pain, shortness of breath, sore throat  Home Medications Prior to Admission medications   Medication Sig Start Date End Date Taking? Authorizing Provider  amoxicillin-clavulanate (AUGMENTIN) 875-125 MG tablet Take 1 tablet by mouth every 12 (twelve) hours. 07/25/22  Yes Jassiah Viviano, Beverly Gust, PA-C  methocarbamol (ROBAXIN) 500 MG tablet Take 1 tablet (500 mg total) by mouth 2 (two) times daily. 07/13/21   Henderly, Britni A, PA-C  naproxen (NAPROSYN) 500 MG tablet Take 1 tablet (500 mg total) by mouth 2 (two) times daily. 07/13/21   Henderly, Britni A, PA-C      Allergies    Patient has no known allergies.    Review of Systems   Review of Systems  HENT:  Positive for ear pain.     Physical Exam Updated Vital Signs BP (!) 129/97   Pulse 70   Temp 98.3 F (36.8 C) (Oral)   Resp 16   Ht 5\' 9"  (1.753 m)   Wt 86.2 kg   SpO2 100%   BMI 28.06 kg/m  Physical Exam Constitutional:      General: He is not in acute distress. HENT:     Head:     Comments: No facial swelling    Ears:     Comments: Left ear: Initially there is cerumen impaction and after cerumen removal there is noted to be a erythematous and perforated tympanic membrane Right ear: Initially cerumen was  present however after removal tympanic membrane was without abnormalities Hearing equal in both sides No mastoid tenderness/ecchymosis    Nose: Nose normal.     Mouth/Throat:     Mouth: Mucous membranes are moist.     Pharynx: No posterior oropharyngeal erythema.  Eyes:     Extraocular Movements: Extraocular movements intact.     Conjunctiva/sclera: Conjunctivae normal.     Pupils: Pupils are equal, round, and reactive to light.  Musculoskeletal:     Cervical back: Normal range of motion.  Neurological:     Mental Status: He is alert.     ED Results / Procedures / Treatments   Labs (all labs ordered are listed, but only abnormal results are displayed) Labs Reviewed - No data to display  EKG None  Radiology No results found.  Procedures Procedures    Medications Ordered in ED Medications - No data to display  ED Course/ Medical Decision Making/ A&P                             Medical Decision Making  Nathaniel Allen 37 y.o. presented today for cerumen impaction. Working DDx that I considered at this time includes, but not limited to, cerumen impaction, AOM, tympanic membrane  perforation, mastoiditis, sinusitis, meningitis.  R/o DDx: mastoiditis, sinusitis, meningitis: These are considered less likely due to history of present illness and physical exam findings  Review of prior external notes: 12/15/2021 ED  Unique Tests and My Interpretation: None  Discussion with Independent Historian: None  Discussion of Management of Tests: None  Risk: Medium: prescription drug management  Risk Stratification Score: None  Plan: Patient presented for cerumen impaction. On exam patient was in no acute distress and stable vitals.  Patient did have cerumen impaction on the left side of his ear with earwax in the right side.  The rest of patient's exam was unremarkable for concerning diseases.  Patient just wants to have his earwax removed and be discharged.  Patient's earwax was  removed and upon visualizing patient's left opaque membrane there is a perforation noted with the erythema of the tympanic membrane.  Patient denied any other symptoms outside of decreased hearing from the cerumen and that his hearing had improved after his ear being cleaned but given the perforated TM and physical exam patient be given Augmentin with primary care follow-up.  Patient did not have bulging tympanic membrane on the left side and so I do not suspect AOM at this time and that the erythema is most likely secondary to the perforation but cannot fully rule out AOM. I educated the patient to avoid swimming or sticking objects in his ear until his eardrum is fully healed.  I Spoke to the patient about return precautions including ear drainage, fevers, mastoid swelling or tenderness, facial swelling or other worsening symptoms.  Patient was given return precautions. Patient stable for discharge at this time.  Patient verbalized understanding of plan.  At time of discharge patient told nurse that he cannot take penicillins otherwise he gets anaphylactic reactions.  After speaking with attending patient would not be given antibiotics and instead will be given same precautions as listed above with ENT follow-up.       Final Clinical Impression(s) / ED Diagnoses Final diagnoses:  Impacted cerumen of left ear  Perforation of left tympanic membrane    Rx / DC Orders ED Discharge Orders          Ordered    amoxicillin-clavulanate (AUGMENTIN) 875-125 MG tablet  Every 12 hours        07/25/22 1200              Netta Corrigan, PA-C 07/25/22 1209    Netta Corrigan, PA-C 07/25/22 1227    Virgina Norfolk, DO 07/25/22 1405
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
